# Patient Record
Sex: Male | Born: 1965 | Race: White | Hispanic: No | Marital: Married | Smoking: Former smoker
Health system: Southern US, Community
[De-identification: ages and names within clinical notes are randomized; demographics above are authoritative.]

## PROBLEM LIST (undated history)

## (undated) DIAGNOSIS — J449 Chronic obstructive pulmonary disease, unspecified: Secondary | ICD-10-CM

---

## 2012-08-30 ENCOUNTER — Emergency Department (INDEPENDENT_AMBULATORY_CARE_PROVIDER_SITE_OTHER)
Admission: EM | Admit: 2012-08-30 | Discharge: 2012-08-30 | Disposition: A | Discharge status: Home / Self Care | Payer: BC Managed Care – PPO | Source: Home / Self Care | Attending: Family Medicine | Admitting: Family Medicine

## 2012-08-30 DIAGNOSIS — J441 Chronic obstructive pulmonary disease with (acute) exacerbation: Secondary | ICD-10-CM

## 2012-08-30 DIAGNOSIS — J449 Chronic obstructive pulmonary disease, unspecified: Secondary | ICD-10-CM | POA: Insufficient documentation

## 2012-08-30 HISTORY — DX: Chronic obstructive pulmonary disease, unspecified: J44.9

## 2012-08-30 MED ORDER — ALBUTEROL SULFATE (2.5 MG/3ML) 0.083% IN NEBU: 2.5000 mg | INHALATION_SOLUTION | Freq: Four times a day (QID) | RESPIRATORY_TRACT | Status: DC | PRN

## 2012-08-30 MED ORDER — DOXYCYCLINE HYCLATE 100 MG PO CAPS: 100.0000 mg | ORAL_CAPSULE | Freq: Two times a day (BID) | ORAL | Status: DC

## 2012-08-30 MED ORDER — ALBUTEROL SULFATE HFA 108 (90 BASE) MCG/ACT IN AERS: 2.0000 | INHALATION_SPRAY | RESPIRATORY_TRACT | Status: DC | PRN

## 2012-08-30 MED ORDER — BUDESONIDE-FORMOTEROL FUMARATE 160-4.5 MCG/ACT IN AERO: 2.0000 | INHALATION_SPRAY | Freq: Two times a day (BID) | RESPIRATORY_TRACT | Status: DC

## 2012-08-30 MED ORDER — PREDNISONE 20 MG PO TABS: 20.0000 mg | ORAL_TABLET | Freq: Two times a day (BID) | ORAL | Status: DC

## 2012-08-30 NOTE — ED Notes (Signed)
Randy Jackson complains of shortness of breath and wheezing for 1 week. He has a history of COPD. Flare up started around Monday.

## 2012-08-30 NOTE — ED Provider Notes (Signed)
CSN: [id number]     Arrival date & time 08/30/12  1241 History   First MD Initiated Contact with Patient 08/30/12 1415     Chief Complaint  Patient presents with  . Shortness of Breath    x 1 week  . Wheezing    x 1 week      HPI Comments: Patient has a history of COPD, often exacerbated by exposure to allergens.  He has had increased sneezing, nasal congestion, shortness of breath with activity, wheezing for one week but minimal cough.  No sore throat or URI symptoms.  No pleuritic pain.  No fevers, chills, and sweats   He had pneumonia earlier this year.  The history is provided by the patient.    Past Medical History  Diagnosis Date  . COPD (chronic obstructive pulmonary disease)    History reviewed. No pertinent past surgical history. History reviewed. No pertinent family history. History  Substance Use Topics  . Smoking status: Current Every Day Smoker -- 1.00 packs/day for 30 years    Types: Cigarettes  . Smokeless tobacco: Never Used  . Alcohol Use: No    Review of Systems No sore throat + cough No pleuritic pain + wheezing + nasal congestion + post-nasal drainage + sneezing No sinus pain/pressure No itchy/red eyes No earache No hemoptysis + SOB with activity No fever/chills No nausea No vomiting No abdominal pain No diarrhea No urinary symptoms No skin rashes No fatigue No myalgias + headache Used OTC meds without relief  Allergies  Review of patient's allergies indicates no known allergies.  Home Medications   Current Outpatient Rx  Name  Route  Sig  Dispense  Refill  . albuterol (ACCUNEB) 1.25 MG/3ML nebulizer solution   Nebulization   Take 1 ampule by nebulization every 6 (six) hours as needed for wheezing.         Marland Kitchen albuterol (PROVENTIL HFA;VENTOLIN HFA) 108 (90 BASE) MCG/ACT inhaler   Inhalation   Inhale 2 puffs into the lungs every 6 (six) hours as needed for wheezing.         . budesonide-formoterol (SYMBICORT) 160-4.5 MCG/ACT  inhaler   Inhalation   Inhale 2 puffs into the lungs 2 (two) times daily.         Marland Kitchen dextromethorphan-guaiFENesin (MUCINEX DM) 30-600 MG per 12 hr tablet   Oral   Take 1 tablet by mouth every 12 (twelve) hours.         Marland Kitchen albuterol (PROVENTIL HFA;VENTOLIN HFA) 108 (90 BASE) MCG/ACT inhaler   Inhalation   Inhale 2 puffs into the lungs every 4 (four) hours as needed for wheezing.   2 Inhaler   2   . albuterol (PROVENTIL) (2.5 MG/3ML) 0.083% nebulizer solution   Nebulization   Take 3 mLs (2.5 mg total) by nebulization every 6 (six) hours as needed for wheezing.   75 mL   1   . budesonide-formoterol (SYMBICORT) 160-4.5 MCG/ACT inhaler   Inhalation   Inhale 2 puffs into the lungs 2 (two) times daily.   1 Inhaler   1   . doxycycline (VIBRAMYCIN) 100 MG capsule   Oral   Take 1 capsule (100 mg total) by mouth 2 (two) times daily.   20 capsule   0   . predniSONE (DELTASONE) 20 MG tablet   Oral   Take 1 tablet (20 mg total) by mouth 2 (two) times daily.   10 tablet   0    BP 132/82  Pulse 71  Temp(Src) 97.9  F (36.6 C) (Oral)  Ht 5\' 10"  (1.778 m)  Wt 185 lb (83.915 kg)  BMI 26.54 kg/m2  SpO2 95% Physical Exam Nursing notes and Vital Signs reviewed. Appearance:  Patient appears healthy, stated age, and in no acute distress Eyes:  Pupils are equal, round, and reactive to light and accomodation.  Extraocular movement is intact.  Conjunctivae are not inflamed  Ears:  Canals normal.  Tympanic membranes normal.  Nose:  Mildly congested turbinates.  No sinus tenderness.   Pharynx:  Normal Neck:  Supple.  Slightly tender shotty posterior nodes are palpated bilaterally  Lungs:   Tubular breath sounds, otherwise clear.  Breath sounds are equal.  Heart:  Regular rate and rhythm without murmurs, rubs, or gallops.  Abdomen:  Nontender without masses or hepatosplenomegaly.  Bowel sounds are present.  No CVA or flank tenderness.  Extremities:  No edema.  No calf tenderness Skin:   No rash present.   ED Course  Procedures  none        MDM   1. COPD exacerbation    Rx written for albuterol inhaler and albuterol for nebulizer.  Rx for Symbicort Begin prednisone burst and doxycycline for 10 days. If symptoms become significantly worse during the night or over the weekend, proceed to the local emergency room.  Followup with Family Doctor if not improved in one week.     Lattie Haw, MD 09/01/12 [phone]

## 2012-09-14 ENCOUNTER — Emergency Department (INDEPENDENT_AMBULATORY_CARE_PROVIDER_SITE_OTHER): Payer: BC Managed Care – PPO

## 2012-09-14 ENCOUNTER — Emergency Department (INDEPENDENT_AMBULATORY_CARE_PROVIDER_SITE_OTHER)
Admission: EM | Admit: 2012-09-14 | Discharge: 2012-09-14 | Disposition: A | Discharge status: Home / Self Care | Payer: BC Managed Care – PPO | Source: Home / Self Care

## 2012-09-14 DIAGNOSIS — R059 Cough, unspecified: Secondary | ICD-10-CM

## 2012-09-14 DIAGNOSIS — B370 Candidal stomatitis: Secondary | ICD-10-CM

## 2012-09-14 DIAGNOSIS — J441 Chronic obstructive pulmonary disease with (acute) exacerbation: Secondary | ICD-10-CM

## 2012-09-14 DIAGNOSIS — R05 Cough: Secondary | ICD-10-CM

## 2012-09-14 DIAGNOSIS — R062 Wheezing: Secondary | ICD-10-CM

## 2012-09-14 DIAGNOSIS — R918 Other nonspecific abnormal finding of lung field: Secondary | ICD-10-CM

## 2012-09-14 LAB — COMPLETE METABOLIC PANEL WITH GFR
BUN: 15 mg/dL (ref 6–23)
CO2: 24 mEq/L (ref 19–32)
Creat: 0.77 mg/dL (ref 0.50–1.35)
GFR, Est African American: >89 mL/min
GFR, Est Non African American: >89 mL/min
Glucose, Bld: 96 mg/dL (ref 70–99)
Sodium: 139 mEq/L (ref 135–145)
Total Bilirubin: 0.7 mg/dL (ref 0.3–1.2)
Total Protein: 6.7 g/dL (ref 6.0–8.3)

## 2012-09-14 LAB — POCT CBC W AUTO DIFF (K'VILLE URGENT CARE)

## 2012-09-14 MED ORDER — METHYLPREDNISOLONE ACETATE 80 MG/ML IJ SUSP
80.0000 mg | Freq: Once | INTRAMUSCULAR | Status: AC
  Administered 2012-09-14: 80 mg via INTRAMUSCULAR

## 2012-09-14 MED ORDER — CLOTRIMAZOLE 10 MG MT TROC: 10.0000 mg | Freq: Every day | OROMUCOSAL | Status: DC

## 2012-09-14 MED ORDER — LEVOFLOXACIN 500 MG PO TABS: 500.0000 mg | ORAL_TABLET | Freq: Every day | ORAL | Status: DC

## 2012-09-14 MED ORDER — IPRATROPIUM-ALBUTEROL 20-100 MCG/ACT IN AERS: 1.0000 | INHALATION_SPRAY | Freq: Four times a day (QID) | RESPIRATORY_TRACT | Status: AC | PRN

## 2012-09-14 MED ORDER — IPRATROPIUM-ALBUTEROL 0.5-2.5 (3) MG/3ML IN SOLN: 3.0000 mL | Freq: Four times a day (QID) | RESPIRATORY_TRACT | Status: AC | PRN

## 2012-09-14 NOTE — ED Notes (Signed)
Randy Jackson complains of shortness of breath, wheezing and cough for 3 weeks. He was seen 2 weeks ago and treated with prednisone. He states he is worse. Other symptoms: headache, runny nose, body aches and dizziness.

## 2012-09-14 NOTE — ED Provider Notes (Signed)
CSN: [id number]     Arrival date & time 09/14/12  1329 History   None    Chief Complaint  Patient presents with  . Wheezing    x 3 weeks  . Cough    x 3 weeks     HPI Comments: Chart reviewed. See previous visit note. Patient reports that he improved after treatment received two weeks ago.  Approximately 2 days later, about a week ago, he felt as if he was developing a new respiratory infection.  He developed recurrent sinus congestion, frontal headache, increased cough, wheezing, and shortness of breath.  He now has shortness of breath at night, and with activity.  He denies fever but has felt hot, with sweats at night.  He reports that albuterol by MDI or nebulizer is no longer effective.  He states that Symbicort is helpful, but he uses it more than twice daily.  He also complains of a developing sore throat. Patient continues to smoke. Family history includes an uncle with COPD.                                                                                             The history is provided by the patient.    Past Medical History  Diagnosis Date  . COPD (chronic obstructive pulmonary disease)    History reviewed. No pertinent past surgical history. History reviewed.  COPD in an uncle  History  Substance Use Topics  . Smoking status: Current Every Day Smoker -- 1.00 packs/day for 30 years    Types: Cigarettes  . Smokeless tobacco: Never Used  . Alcohol Use: No    Review of Systems + sore throat + cough No pleuritic pain + wheezing + nasal congestion + post-nasal drainage No sinus pain/pressure No itchy/red eyes No earache No hemoptysis + SOB No fever, + chills No nausea No vomiting No abdominal pain No diarrhea No urinary symptoms No skin rashes + fatigue No myalgias + headache Used OTC meds without relief  Allergies  Review of patient's allergies indicates no known allergies.  Home Medications   Current Outpatient Rx  Name  Route  Sig  Dispense   Refill  . albuterol (PROVENTIL HFA;VENTOLIN HFA) 108 (90 BASE) MCG/ACT inhaler   Inhalation   Inhale 2 puffs into the lungs every 4 (four) hours as needed for wheezing.   2 Inhaler   2   . albuterol (PROVENTIL) (2.5 MG/3ML) 0.083% nebulizer solution   Nebulization   Take 3 mLs (2.5 mg total) by nebulization every 6 (six) hours as needed for wheezing.   75 mL   1   . budesonide-formoterol (SYMBICORT) 160-4.5 MCG/ACT inhaler   Inhalation   Inhale 2 puffs into the lungs 2 (two) times daily.         Marland Kitchen dextromethorphan-guaiFENesin (MUCINEX DM) 30-600 MG per 12 hr tablet   Oral   Take 1 tablet by mouth every 12 (twelve) hours.         Marland Kitchen albuterol (ACCUNEB) 1.25 MG/3ML nebulizer solution   Nebulization   Take 1 ampule by nebulization every 6 (six) hours as needed for wheezing.         Marland Kitchen  albuterol (PROVENTIL HFA;VENTOLIN HFA) 108 (90 BASE) MCG/ACT inhaler   Inhalation   Inhale 2 puffs into the lungs every 6 (six) hours as needed for wheezing.         . budesonide-formoterol (SYMBICORT) 160-4.5 MCG/ACT inhaler   Inhalation   Inhale 2 puffs into the lungs 2 (two) times daily.   1 Inhaler   1   . clotrimazole (MYCELEX) 10 MG troche   Oral   Take 1 tablet (10 mg total) by mouth 5 (five) times daily.   70 tablet   0   . doxycycline (VIBRAMYCIN) 100 MG capsule   Oral   Take 1 capsule (100 mg total) by mouth 2 (two) times daily.   20 capsule   0   . Ipratropium-Albuterol (COMBIVENT) 20-100 MCG/ACT AERS respimat   Inhalation   Inhale 1 puff into the lungs every 6 (six) hours as needed.   1 Inhaler   1   . ipratropium-albuterol (DUONEB) 0.5-2.5 (3) MG/3ML SOLN   Nebulization   Take 3 mLs by nebulization every 6 (six) hours as needed.   360 mL   0   . levofloxacin (LEVAQUIN) 500 MG tablet   Oral   Take 1 tablet (500 mg total) by mouth daily.   7 tablet   0   . predniSONE (DELTASONE) 20 MG tablet   Oral   Take 1 tablet (20 mg total) by mouth 2 (two) times  daily.   10 tablet   0    BP 127/79  Pulse 93  Temp(Src) 97.9 F (36.6 C) (Oral)  Ht 5\' 10"  (1.778 m)  Wt 183 lb (83.008 kg)  BMI 26.26 kg/m2  SpO2 92% Physical Exam Nursing notes and Vital Signs reviewed. Appearance:  Patient appears stated age, and in no acute distress Eyes:  Pupils are equal, round, and reactive to light and accomodation.  Extraocular movement is intact.  Conjunctivae are not inflamed  Ears:  Canals normal.  Tympanic membranes normal.  Nose:  Mildly congested turbinates.  No sinus tenderness.    Pharynx:  Erythematous soft palate and pharynx with multiple small white plaques Neck:  Supple.  Slightly tender shotty posterior nodes are palpated bilaterally  Lungs:   Diffuse rhonchi and musical wheezes throughout, heard best posteriorly.    Heart:  Regular rate and rhythm without murmurs, rubs, or gallops.  Abdomen:  Nontender without masses or hepatosplenomegaly.  Bowel sounds are present.  No CVA or flank tenderness.  Extremities:  No edema.  No calf tenderness Skin:  No rash present.   ED Course  Procedures  DuoNeb nebulizer treatment.  Patient reports decreased shortness of breath and wheezing afterwards.  Lungs reveal significant decrease in rhonchi and wheezes                                                          Labs Reviewed     ALPHA-1-ANTITRYPSIN pending  COMPLETE METABOLIC PANEL WITH GFR pending  POCT CBC W AUTO DIFF ([facility]) WBC 11.2; LY 21.2; MO 7.2; GR 71.6; Hgb 16.0; Platelets 186    Imaging Review Dg Chest 2    09/14/2012   CLINICAL DATA:  Cough and wheezing for 3 weeks  EXAM: CHEST  2 VIEW  COMPARISON:  None.  FINDINGS: Cardiac size is unremarkable. No acute infiltrate  or pulmonary edema. Central mild bronchitic changes. There is subtle mild right upper paratracheal soft tissue prominence. Although this may be vascular in nature adenopathy cannot be excluded. Further correlation with CT scan with IV contrast is recommended. Bony  thorax is unremarkable.  IMPRESSION: No acute infiltrate or pulmonary edema. Central mild bronchitic changes. There is subtle mild right upper paratracheal soft tissue prominence. Although this may be vascular in nature adenopathy cannot be excluded. Further correlation with CT scan with IV contrast is recommended. Bony thorax is unremarkable.   Electronically Signed   By: Natasha Mead   On: 09/14/2012 14:45    MDM   1. Wheezing   2. Obstructive chronic bronchitis with exacerbation.  Note mild leukocytosis.  Note paratracheal soft tissue prominence on chest X-ray.   3. Oral candidiasis    DepoMedrol 80mg  IM.  Begin Levaquin. Discontinue albuterol and begin ipratropium/albuterol either by MDI or nebulizer. Continue Symbicort.  Advised to use Symbicort ONLY twice daily.  Advised to rinse mouth after each use of Symbicort inhaler. Begin Mycelex troches for two weeks. Take plain Mucinex (guaifenesin) twice daily for cough and congestion.  Increase fluid intake, rest. Screen for alpha1-antitrypsin deficiency.  Check hepatic function as well with CMP Refer to pulmonologist approximately 10 days for follow-up CT scan chest with IV contrast, long-term management, and address smoking cessation. Patient also advised to establish relationship with a PCP. If symptoms become significantly worse during the night or over the weekend, proceed to the local emergency room.        Lattie Haw, MD 09/14/12 [phone]

## 2012-09-15 ENCOUNTER — Telehealth: Payer: Self-pay | Admitting: *Deleted

## 2012-09-17 ENCOUNTER — Telehealth: Payer: Self-pay | Admitting: *Deleted

## 2012-11-25 ENCOUNTER — Emergency Department (INDEPENDENT_AMBULATORY_CARE_PROVIDER_SITE_OTHER): Payer: BC Managed Care – PPO

## 2012-11-25 ENCOUNTER — Telehealth: Payer: Self-pay | Admitting: *Deleted

## 2012-11-25 ENCOUNTER — Emergency Department (INDEPENDENT_AMBULATORY_CARE_PROVIDER_SITE_OTHER)
Admission: EM | Admit: 2012-11-25 | Discharge: 2012-11-25 | Disposition: A | Discharge status: Home / Self Care | Payer: BC Managed Care – PPO | Source: Home / Self Care | Attending: Emergency Medicine | Admitting: Emergency Medicine

## 2012-11-25 ENCOUNTER — Encounter: Payer: Self-pay | Admitting: Emergency Medicine

## 2012-11-25 DIAGNOSIS — R0602 Shortness of breath: Secondary | ICD-10-CM

## 2012-11-25 DIAGNOSIS — J45901 Unspecified asthma with (acute) exacerbation: Secondary | ICD-10-CM

## 2012-11-25 DIAGNOSIS — J209 Acute bronchitis, unspecified: Secondary | ICD-10-CM

## 2012-11-25 DIAGNOSIS — J441 Chronic obstructive pulmonary disease with (acute) exacerbation: Secondary | ICD-10-CM

## 2012-11-25 DIAGNOSIS — J438 Other emphysema: Secondary | ICD-10-CM

## 2012-11-25 MED ORDER — CLARITHROMYCIN 500 MG PO TABS: ORAL_TABLET | ORAL | Status: DC

## 2012-11-25 MED ORDER — CEFTRIAXONE SODIUM 1 G IJ SOLR
1.0000 g | INTRAMUSCULAR | Status: AC
  Administered 2012-11-25: 1 g via INTRAMUSCULAR

## 2012-11-25 MED ORDER — PREDNISONE 10 MG PO TABS: ORAL_TABLET | ORAL | Status: DC

## 2012-11-25 MED ORDER — IPRATROPIUM-ALBUTEROL 0.5-2.5 (3) MG/3ML IN SOLN
3.0000 mL | RESPIRATORY_TRACT | Status: AC
  Administered 2012-11-25: 3 mL via RESPIRATORY_TRACT

## 2012-11-25 MED ORDER — METHYLPREDNISOLONE ACETATE 80 MG/ML IJ SUSP
80.0000 mg | Freq: Once | INTRAMUSCULAR | Status: AC
  Administered 2012-11-25: 80 mg via INTRAMUSCULAR

## 2012-11-25 NOTE — ED Provider Notes (Signed)
CSN: [id number]     Arrival date & time 11/25/12  1218 History   First MD Initiated Contact with Patient 11/25/12 1234     Chief Complaint  Patient presents with  . URI  . Shortness of Breath    HPI Trip began having respiratory symptoms, cough, wheezing 3 months ago. He has been seen twice and treated with combivent, duo nebs and Levaquin, and 6 day prednisone Dosepak.   Complains of worsening dyspnea with exertion and @ rest, with cough productive of yellow sputum, x4 days.  +Smoker.--He states he's cut back to smoking a half pack a day. He states he's smoked about 2 packs a day for 30 years, equals 60 pack years smoking history.  Was last seen here in urgent care 09/14/2012 for wheezing and obstructive chronic bronchitis with exacerbation. At that time, treated with a 6 day prednisone Dosepak, and he feels that definitely helped some, but that he needed more days of that dose pack to get the full benefit. Chest x-ray then showed paratracheal soft tissue prominence on chest x-ray. Our office had originally made a referral appointment for patient to see a pulmonologist on 10/01/2012, but for some reason, he did not go to that appointment.--He states that it might be that his cell phone was not working at that time.  He presents today with 4 days of worsening cough productive of yellow sputum with worsening wheezing and dyspnea on exertion. Associated symptoms: No hemoptysis. No syncope. Denies chest pain at rest or with exertion.  Used Combivent past few weeks, and that helped some. Used duo neb at home and that helped somewhat about 7 hours ago.  After further questioning regarding his past medical history, he states that he was diagnosed with asthma as a child, but that he "outgrew it"  Other labs ordered by Dr Isac Sarna from 09/14/2012: CMP was normal including normal glucose. Hemoglobin 16.0, WBC 11.2 with 71% granulocytes. Alpha-1 anti-trypsin level was normal at 109 (normal range  90-200)  Past Medical History  Diagnosis Date  . COPD (chronic obstructive pulmonary disease)    History reviewed. No pertinent past surgical history. History reviewed. No pertinent family history. History  Substance Use Topics  . Smoking status: Current Every Day Smoker -- 1.00 packs/day for 30 years    Types: Cigarettes  . Smokeless tobacco: Never Used  . Alcohol Use: No    Review of Systems  Constitutional: Positive for fatigue.  HENT: Negative for ear pain, nosebleeds and rhinorrhea.   Eyes:       Occasional blurred vision  Respiratory: Positive for shortness of breath and wheezing.   Cardiovascular: Negative for chest pain, palpitations and leg swelling.  Gastrointestinal: Negative.  Negative for nausea, vomiting, diarrhea and blood in stool.  Genitourinary: Negative.   Musculoskeletal: Negative.  Negative for joint swelling.  Skin: Negative.  Negative for rash.  Allergic/Immunologic: Negative for immunocompromised state.  Neurological: Negative.   Hematological: Negative.   Psychiatric/Behavioral: Negative.     Allergies  Review of patient's allergies indicates no known allergies.  Home Medications   Current Outpatient Rx  Name  Route  Sig  Dispense  Refill  . budesonide-formoterol (SYMBICORT) 160-4.5 MCG/ACT inhaler   Inhalation   Inhale 2 puffs into the lungs 2 (two) times daily.         . budesonide-formoterol (SYMBICORT) 160-4.5 MCG/ACT inhaler   Inhalation   Inhale 2 puffs into the lungs 2 (two) times daily.   1 Inhaler   1   .  clarithromycin (BIAXIN) 500 MG tablet      Take 1 twice a day for 10 days.   20 tablet   0   . Ipratropium-Albuterol (COMBIVENT) 20-100 MCG/ACT AERS respimat   Inhalation   Inhale 1 puff into the lungs every 6 (six) hours as needed.   1 Inhaler   1   . ipratropium-albuterol (DUONEB) 0.5-2.5 (3) MG/3ML SOLN   Nebulization   Take 3 mLs by nebulization every 6 (six) hours as needed.   360 mL   0   . predniSONE  (DELTASONE) 10 MG tablet      Days 1 & 2, take 6 daily. Days 3 & 4, take 5 daily. Days 5 & 6: 4 daily. Days 7 &8: 3 daily. Days 9 &10: 2 daily. Days 11 &12: 1 daily.   42 tablet   0    BP 154/85  Pulse 102  Temp(Src) 97.7 F (36.5 C) (Oral)  Resp 16  Wt 186 lb (84.369 kg)  SpO2 92% Physical Exam  Nursing note and vitals reviewed. Constitutional: He is oriented to person, place, and time. He appears well-developed and well-nourished. He is active and cooperative.  Non-toxic appearance. He appears ill. He appears distressed (Mildly dyspneic at rest ).  HENT:  Head: Normocephalic and atraumatic.  Right Ear: Tympanic membrane normal.  Left Ear: Tympanic membrane normal.  Nose: Nose normal.  Mouth/Throat: Oropharynx is clear and moist. No oropharyngeal exudate.  Eyes: Conjunctivae are normal. Pupils are equal, round, and reactive to light. Right eye exhibits no discharge. Left eye exhibits no discharge. No scleral icterus.  Neck: Neck supple. No JVD present. No tracheal deviation present. No thyromegaly present.  Cardiovascular: Normal rate, regular rhythm and normal heart sounds.  Exam reveals no gallop.   No murmur heard. Pulmonary/Chest: No respiratory distress. He has wheezes (Diffuse, inspiratory and especially late expiratory wheezes throughout ). He has rhonchi. He has no rales.  Abdominal: Soft. He exhibits no distension.  Musculoskeletal: Normal range of motion. He exhibits no edema and no tenderness.  Mild clubbing of fingernails  Lymphadenopathy:    He has no cervical adenopathy.  Neurological: He is alert and oriented to person, place, and time.  Skin: Skin is warm and dry. No rash noted.  Psychiatric: He has a normal mood and affect.    ED Course  Procedures (including critical care time) Labs Review Labs Reviewed - No data to display Imaging Review Dg Chest 2 View  11/25/2012   CLINICAL DATA:  Shortness of breath.  COPD.  EXAM: CHEST  2 VIEW  COMPARISON:  PA and  lateral chest 09/14/2012.  FINDINGS: The chest is hyperexpanded with attenuation of the pulmonary vasculature. No consolidative process, pneumothorax or effusion. Heart size is normal.  IMPRESSION: Emphysema without acute disease.   Electronically Signed   By: Drusilla Kanner M.D.   On: 11/25/2012 13:32    EKG Interpretation    Date/Time:    Ventricular Rate:    PR Interval:    QRS Duration:   QT Interval:    QTC Calculation:   R Axis:     Text Interpretation:             DuoNeb nebulizer treatment given , then patient reassessed. He felt that improved his symptoms. Lungs rechecked, still has mild late expiratory wheezes throughout and mild diffuse rhonchi. Aeration improved.--Pulse ox 95% on room air. Pulse 96. Chest x-ray ordered, which showed hyperexpansion with attenuation of the pulmonary vasculature. Heart size normal. No  infiltrate or consolidation or pneumothorax or effusion.  MDM   1. Asthma, chronic obstructive, with acute exacerbation   2. COPD exacerbation   3. Acute bronchitis    Reviewed all the above. We discussed treatment options at length. Risks, benefits, alternatives discussed. Rocephin 1 g IM stat Depo-Medrol 80 mg IM stat Prednisone 10 mg-12 day Dosepak Biaxin 500 mg twice a day x10 days Continue home DuoNeb nebulizer treatment every 6 hours when necessary. While patient was still here in urgent care, we called and arranged referral to Dr. Eulah Pont at [facility] in [place], for pulmonary consultation and management.--Appointment made for 12/01/2012.--While patient was still here in urgent care, we had him speak directly with the appointment scheduler at [facility], and he was given specific information/details regarding this appointment, and written instructions given, and he voiced understanding. Precautions discussed. Red flags discussed.--Go to ER stat if any severe, new, or worsening symptoms. Questions invited and answered. Patient voiced  understanding and agreement.    Lajean Manes, MD 11/25/12 [phone]

## 2012-11-25 NOTE — ED Notes (Signed)
Randy Jackson began having a uri 3 months ago. He has been seen twice and treated with combivent, duo nebs and Levaquin. He is not improving. SOB with exertion and @ rest. Smoker.

## 2013-05-08 ENCOUNTER — Encounter: Payer: Self-pay | Admitting: Emergency Medicine

## 2013-05-08 ENCOUNTER — Emergency Department
Admission: EM | Admit: 2013-05-08 | Discharge: 2013-05-08 | Disposition: A | Discharge status: Home / Self Care | Payer: Self-pay | Source: Home / Self Care | Attending: Emergency Medicine | Admitting: Emergency Medicine

## 2013-05-08 ENCOUNTER — Emergency Department (INDEPENDENT_AMBULATORY_CARE_PROVIDER_SITE_OTHER): Payer: Self-pay

## 2013-05-08 DIAGNOSIS — M542 Cervicalgia: Secondary | ICD-10-CM

## 2013-05-08 DIAGNOSIS — S139XXA Sprain of joints and ligaments of unspecified parts of neck, initial encounter: Secondary | ICD-10-CM

## 2013-05-08 DIAGNOSIS — M545 Low back pain, unspecified: Secondary | ICD-10-CM

## 2013-05-08 DIAGNOSIS — S39012A Strain of muscle, fascia and tendon of lower back, initial encounter: Secondary | ICD-10-CM

## 2013-05-08 DIAGNOSIS — S161XXA Strain of muscle, fascia and tendon at neck level, initial encounter: Secondary | ICD-10-CM

## 2013-05-08 DIAGNOSIS — S335XXA Sprain of ligaments of lumbar spine, initial encounter: Secondary | ICD-10-CM

## 2013-05-08 MED ORDER — MELOXICAM 15 MG PO TABS: ORAL_TABLET | ORAL | Status: AC

## 2013-05-08 MED ORDER — CYCLOBENZAPRINE HCL 5 MG PO TABS: ORAL_TABLET | ORAL | Status: AC
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
## 2014-08-04 IMAGING — CR DG CHEST 2V
2 series · 2 of 2 positions shown · non-contrast
Comparison: None.

CLINICAL DATA: Cough and wheezing for 3 weeks

EXAM:
CHEST  2 VIEW

[view not recorded (1 of 2)]
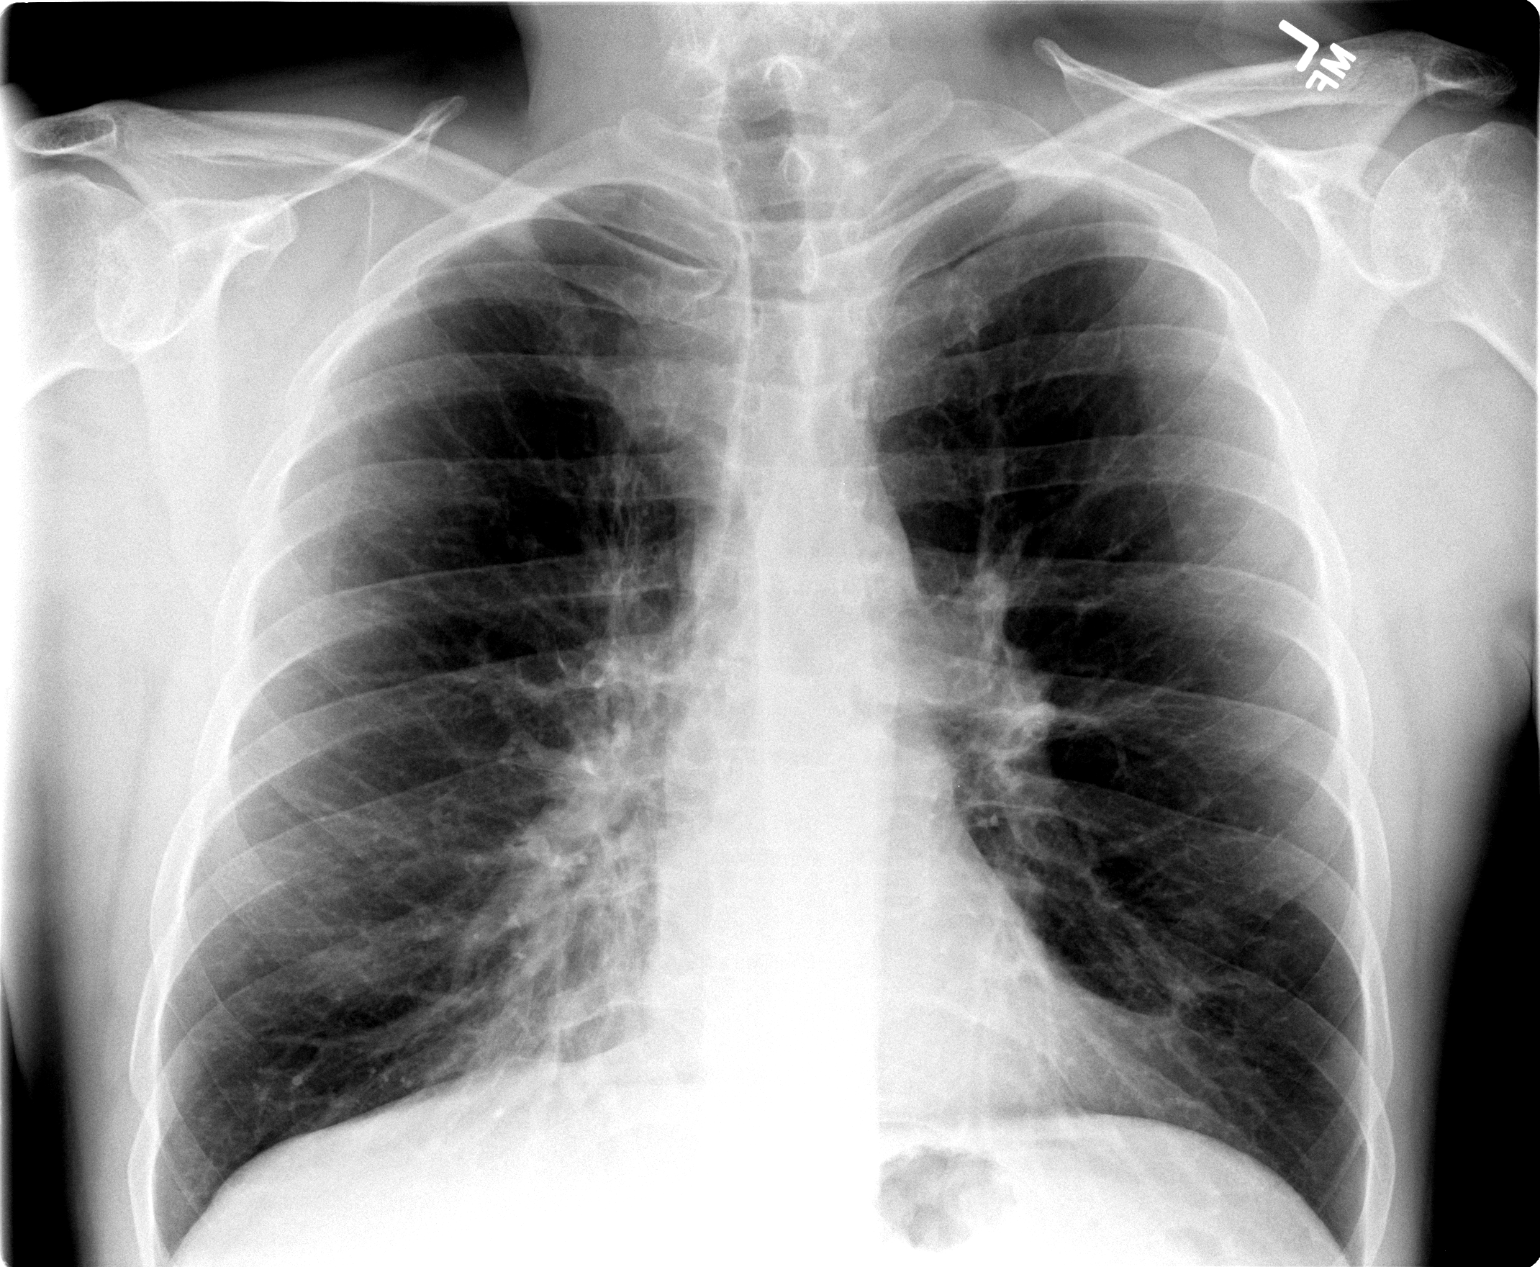

[view not recorded (2 of 2)]
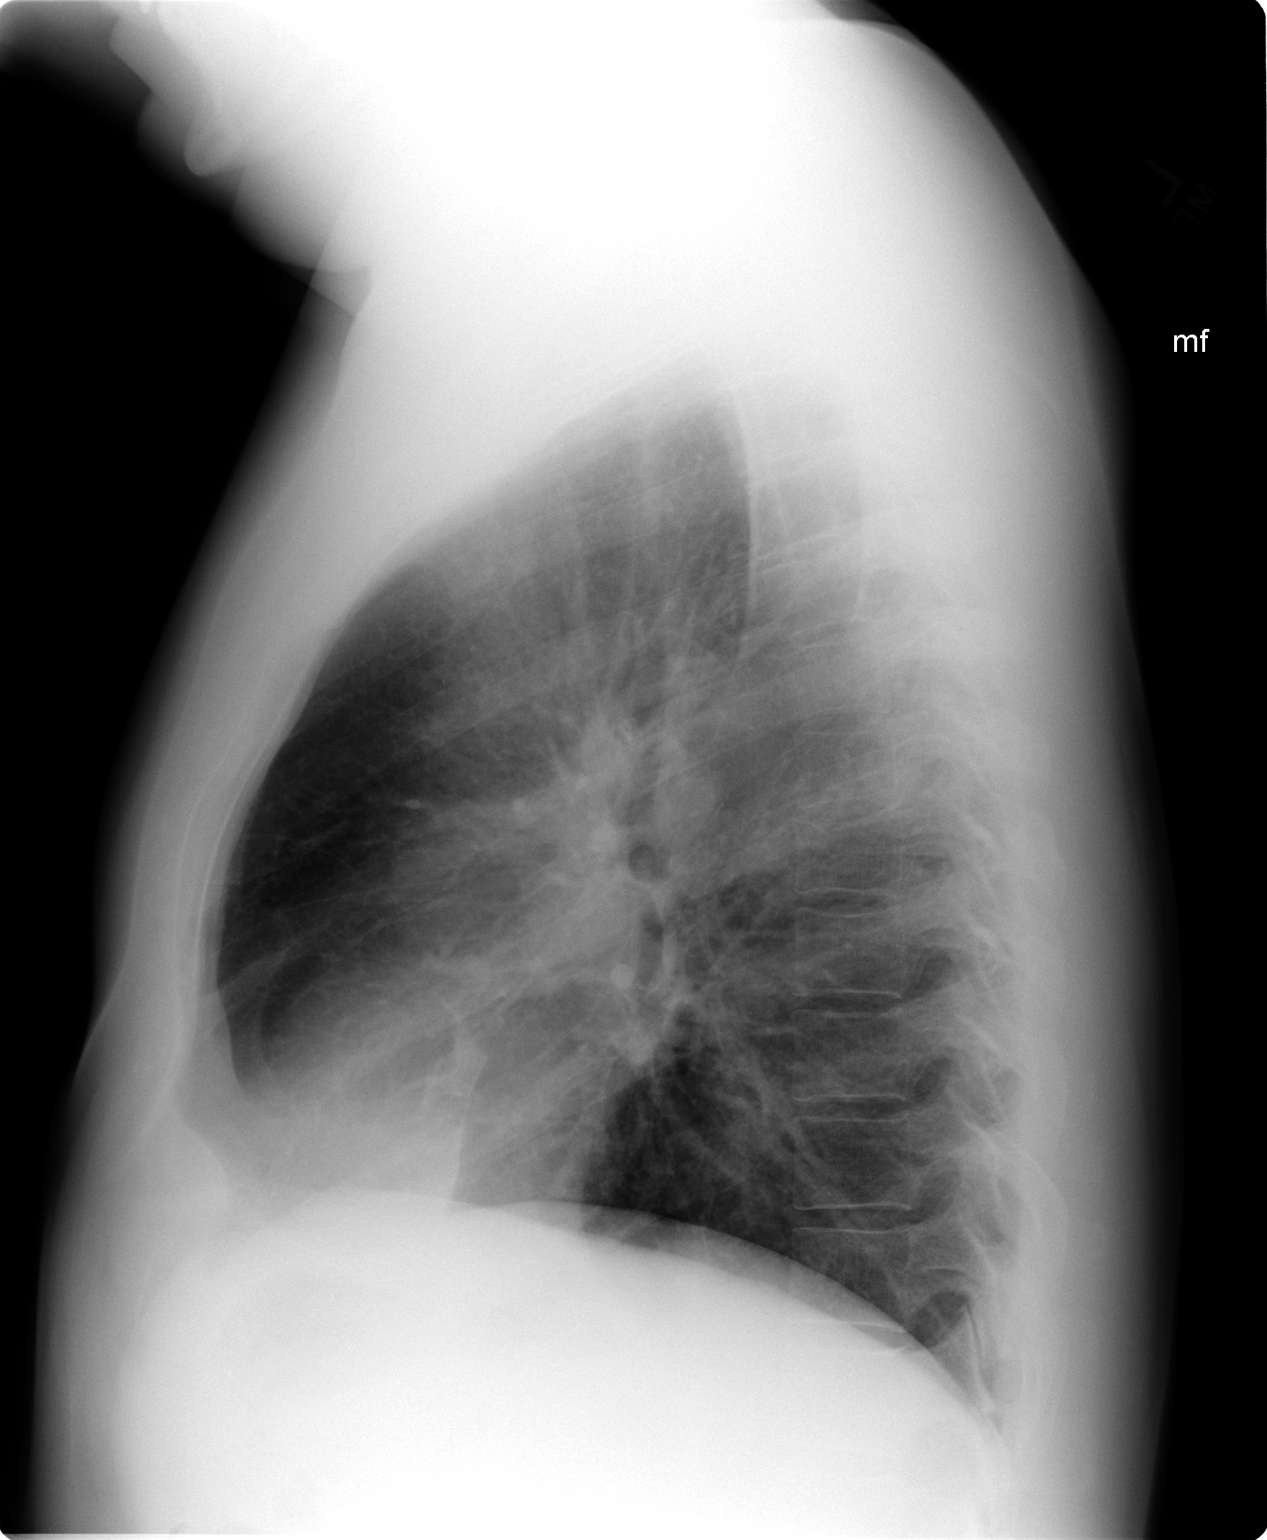

[2 of 2 positions shown; findings below may reference images not displayed]

FINDINGS: Cardiac size is unremarkable. No acute infiltrate or pulmonary
edema. Central mild bronchitic changes. There is subtle mild right
upper paratracheal soft tissue prominence. Although this may be
vascular in nature adenopathy cannot be excluded. Further
correlation with CT scan with IV contrast is recommended. Bony
thorax is unremarkable.
IMPRESSION: No acute infiltrate or pulmonary edema. Central mild bronchitic
changes. There is subtle mild right upper paratracheal soft tissue
prominence. Although this may be vascular in nature adenopathy
cannot be excluded. Further correlation with CT scan with IV
contrast is recommended. Bony thorax is unremarkable.

## 2014-10-15 IMAGING — CR DG CHEST 2V
2 series · 2 of 2 positions shown · non-contrast
Comparison: PA and lateral chest 09/14/2012.

CLINICAL DATA: Shortness of breath.  COPD.

EXAM:
CHEST  2 VIEW

[view not recorded (1 of 2)]
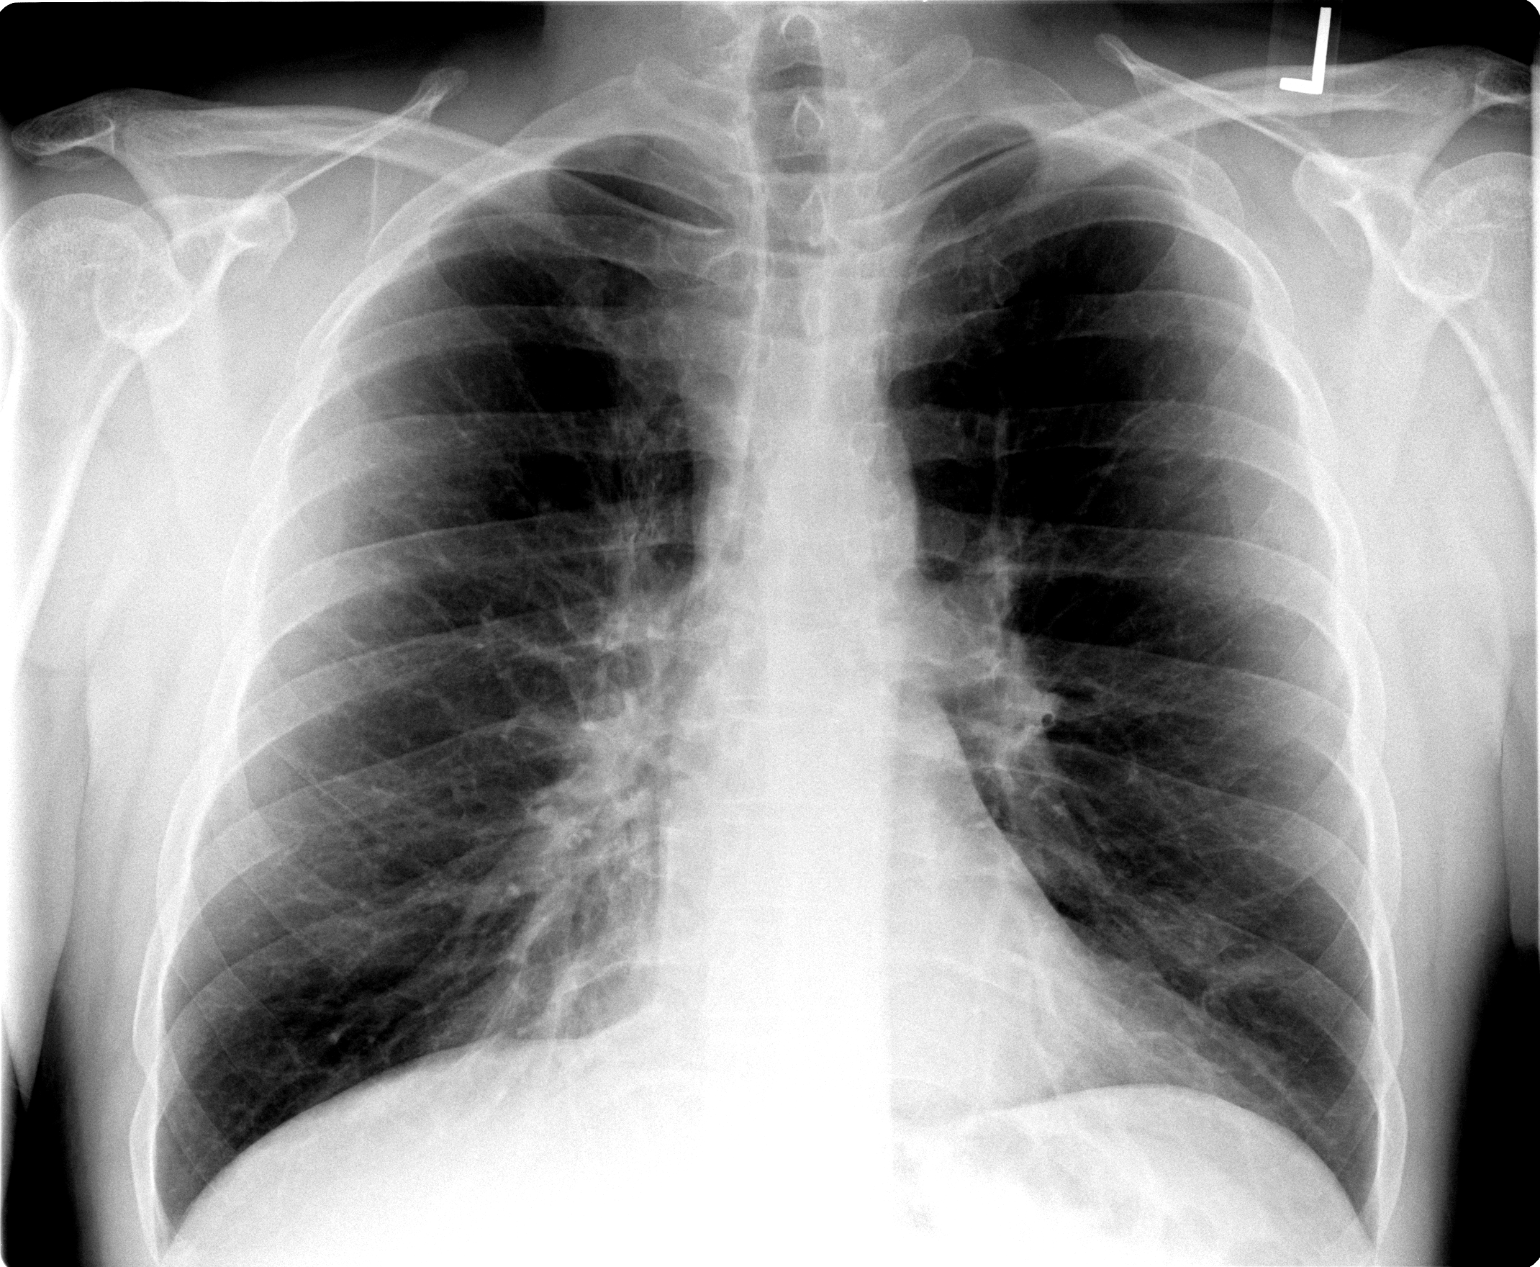

[view not recorded (2 of 2)]
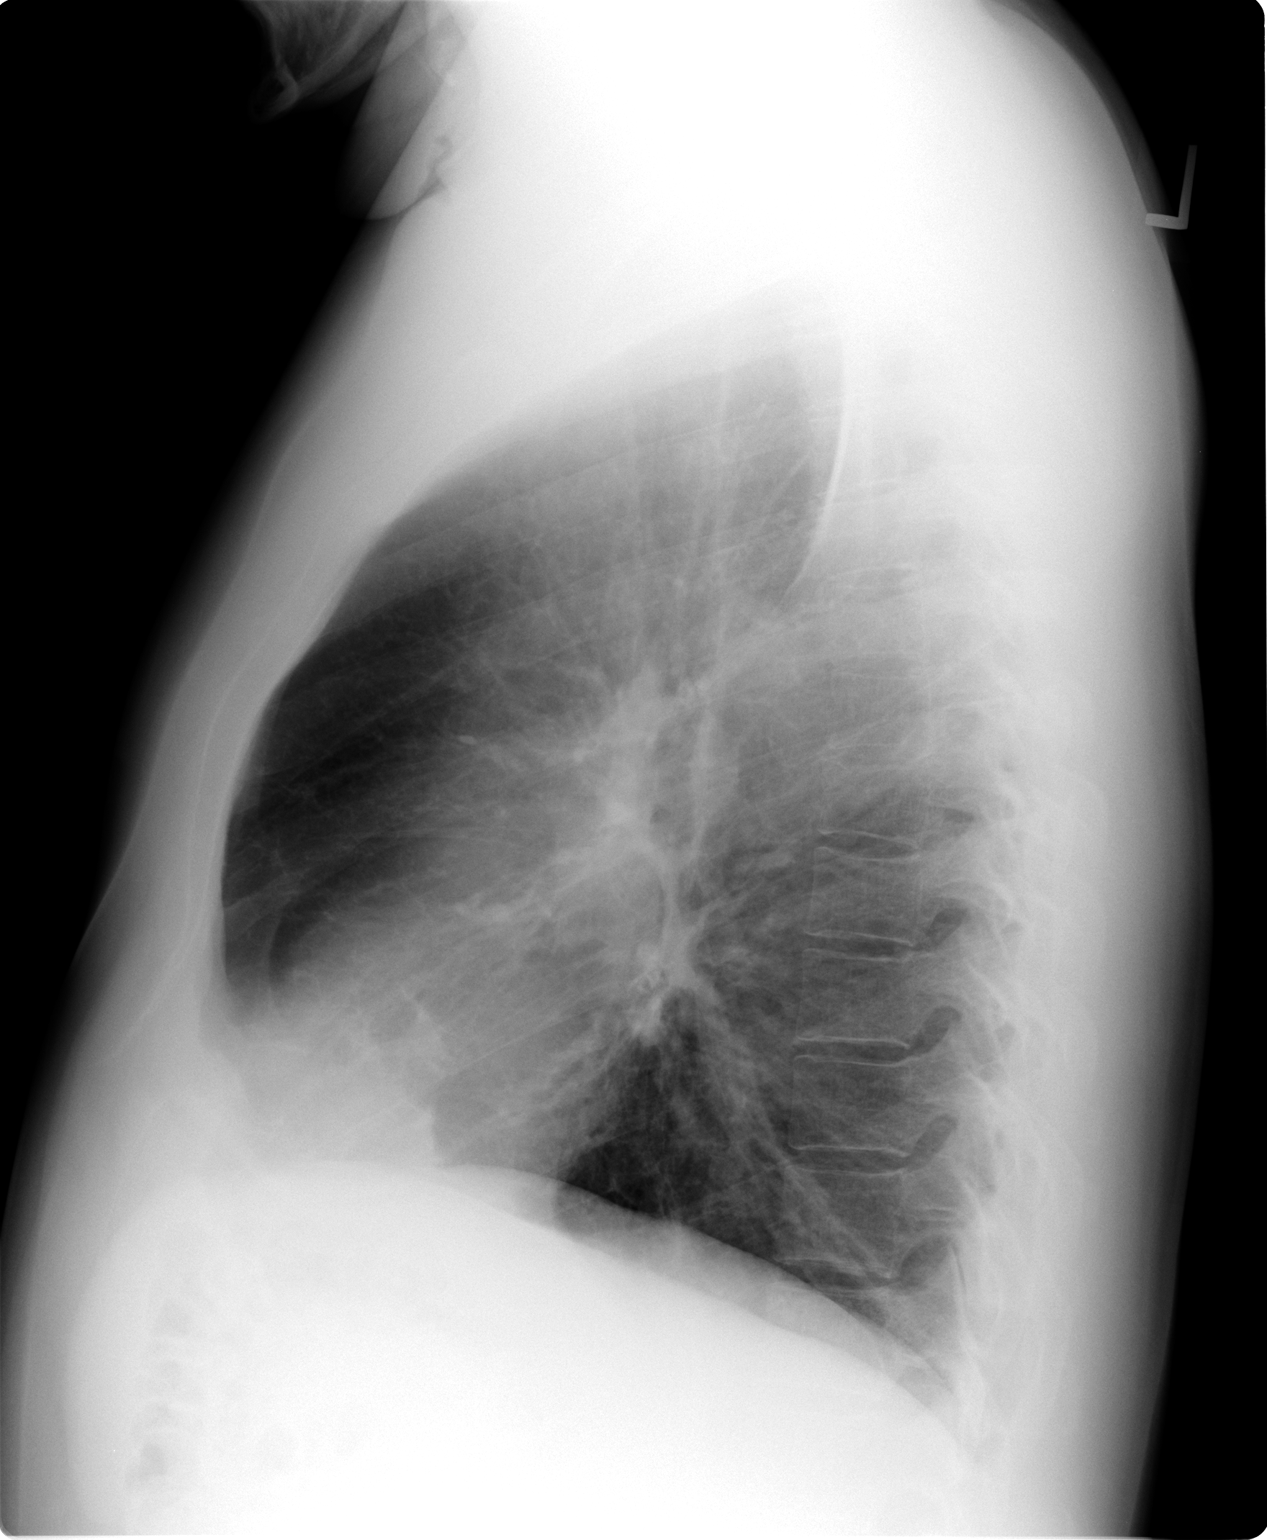

[2 of 2 positions shown; findings below may reference images not displayed]

FINDINGS: The chest is hyperexpanded with attenuation of the pulmonary
vasculature. No consolidative process, pneumothorax or effusion.
Heart size is normal.
IMPRESSION: Emphysema without acute disease.

## 2017-02-12 ENCOUNTER — Emergency Department (INDEPENDENT_AMBULATORY_CARE_PROVIDER_SITE_OTHER)
Admission: EM | Admit: 2017-02-12 | Discharge: 2017-02-12 | Disposition: A | Discharge status: Home / Self Care | Payer: BLUE CROSS/BLUE SHIELD | Source: Home / Self Care | Attending: Family Medicine | Admitting: Family Medicine

## 2017-02-12 ENCOUNTER — Other Ambulatory Visit: Payer: Self-pay

## 2017-02-12 DIAGNOSIS — J441 Chronic obstructive pulmonary disease with (acute) exacerbation: Secondary | ICD-10-CM

## 2017-02-12 MED ORDER — PREDNISONE 20 MG PO TABS: ORAL_TABLET | ORAL | 0 refills | Status: AC

## 2017-02-12 MED ORDER — METHYLPREDNISOLONE SODIUM SUCC 125 MG IJ SOLR
80.0000 mg | Freq: Once | INTRAMUSCULAR | Status: AC
  Administered 2017-02-12: 80 mg via INTRAMUSCULAR

## 2017-02-12 MED ORDER — DOXYCYCLINE HYCLATE 100 MG PO CAPS: 100.0000 mg | ORAL_CAPSULE | Freq: Two times a day (BID) | ORAL | 0 refills | Status: AC

## 2017-02-12 NOTE — ED Triage Notes (Signed)
Started last Monday with headache, fever, and aching joints.  The last few days, the headache has continued, and headache has not gone away. Last medication was 2 aleve at 9 am.

## 2017-02-12 NOTE — Discharge Instructions (Signed)
Begin prednisone Wednesday 02/13/17. Continue plain guaifenesin daily, with plenty of water, for cough and congestion.   Get adequate rest.   May use Afrin nasal spray (or generic oxymetazoline) each morning for about 5 days and then discontinue.  Also recommend using saline nasal spray several times daily and saline nasal irrigation (AYR is a common brand).  Use Flonase nasal spray each morning after using Afrin nasal spray and saline nasal irrigation. Try warm salt water gargles for sore throat.  Stop all antihistamines for now, and other non-prescription cough/cold preparations.  If symptoms become significantly worse during the night or over the weekend, proceed to the local emergency room.  Continue albuterol by nebulizer as needed.  Continue inhalers as prescribed.

## 2017-02-12 NOTE — ED Provider Notes (Signed)
[facility]    CSN: [id number] Arrival date & time: 02/12/17  1119     History   Chief Complaint Chief Complaint  Patient presents with  . Headache  . Cough    HPI Randy Buffric D. Janee Mornhompson is a 52 y.o. male.   Patient reports that he developed flu symptoms 8 days ago.  He complains of persistent headache and cough, with occasional wheezing and shortness of breath.  He denies fevers, chills, and sweats.  No pleuritic pain.  He has COPD and past history of pneumonia, followed by [facility].   The history is provided by the patient.    Past Medical History:  Diagnosis Date  . COPD (chronic obstructive pulmonary disease) [facility](HCC)     Patient Active Problem List   Diagnosis Date Noted  . COPD (chronic obstructive pulmonary disease) (HCC)     History reviewed. No pertinent surgical history.     Home Medications    Prior to Admission medications   Medication Sig Start Date End Date Taking? Authorizing Provider  budesonide-formoterol (SYMBICORT) 160-4.5 MCG/ACT inhaler Inhale 2 puffs into the lungs 2 (two) times daily.    [provider]  cyclobenzaprine (FLEXERIL) 5 MG tablet Take 1 every 8 hours as needed for muscle relaxant. Caution: May cause drowsiness. 05/08/13   Lajean ManesMassey, David, MD  doxycycline (VIBRAMYCIN) 100 MG capsule Take 1 capsule (100 mg total) by mouth 2 (two) times daily. Take with food. 02/12/17   Lattie HawBeese, Stephen A, MD  Ipratropium-Albuterol (COMBIVENT) 20-100 MCG/ACT AERS respimat Inhale 1 puff into the lungs every 6 (six) hours as needed. 09/14/12   Lattie HawBeese, Stephen A, MD  ipratropium-albuterol (DUONEB) 0.5-2.5 (3) MG/3ML SOLN Take 3 mLs by nebulization every 6 (six) hours as needed. 09/14/12   Lattie HawBeese, Stephen A, MD  meloxicam (MOBIC) 15 MG tablet Take 1 tablet by mouth daily for pain/inflammation. Take with food. Do not take other NSAIDs with this 05/08/13   Lajean ManesMassey, David, MD  predniSONE (DELTASONE) 20 MG tablet Take one tab by mouth twice  daily for 5 days, then one daily for 3 days. Take with food. 02/12/17   Lattie HawBeese, Stephen A, MD    Family History History reviewed. No pertinent family history.  Social History Social History   Tobacco Use  . Smoking status: Former Smoker    Packs/day: 1.00    Years: 30.00    Pack years: 30.00    Types: Cigarettes  . Smokeless tobacco: Never Used  Substance Use Topics  . Alcohol use: Yes  . Drug use: No     Allergies   Patient has no known allergies.   Review of Systems Review of Systems No sore throat + cough No pleuritic pain + wheezing + nasal congestion ? post-nasal drainage No sinus pain/pressure No itchy/red eyes No earache No hemoptysis + SOB No fever/chills No nausea No vomiting No abdominal pain No diarrhea No urinary symptoms No skin rash + fatigue No myalgias + arthralgias + headache Used OTC meds without relief   Physical Exam Triage Vital Signs ED Triage Vitals [02/12/17 1334]  Enc Vitals Group     BP 124/80     Pulse Rate 82     Resp      Temp 97.8 F (36.6 C)     Temp Source Oral     SpO2 93 %     Weight 198 lb (89.8 kg)     Height 5\' 10"  (1.778 m)     Head Circumference  Peak Flow      Pain Score 3     Pain Loc      Pain Edu?      Excl. in GC?    No data found.  Updated Vital Signs BP 124/80 (BP Location: Right Arm)   Pulse 82   Temp 97.8 F (36.6 C) (Oral)   Ht 5\' 10"  (1.778 m)   Wt 198 lb (89.8 kg)   SpO2 93%   BMI 28.41 kg/m   Visual Acuity Right Eye Distance:   Left Eye Distance:   Bilateral Distance:    Right Eye Near:   Left Eye Near:    Bilateral Near:     Physical Exam Nursing notes and Vital Signs reviewed. Appearance:  Patient appears stated age, and in no acute distress Eyes:  Pupils are equal, round, and reactive to light and accomodation.  Extraocular movement is intact.  Conjunctivae are not inflamed  Ears:  Canals normal.  Tympanic membranes normal.  Nose:  Mildly congested turbinates.   No sinus tenderness.  Pharynx:  Normal Neck:  Supple.  Enlarged posterior/lateral nodes are palpated bilaterally, tender to palpation on the left.   Lungs:  Course breath sounds.  Breath sounds are equal.  Moving air well. Heart:  Regular rate and rhythm without murmurs, rubs, or gallops.  Abdomen:  Nontender without masses or hepatosplenomegaly.  Bowel sounds are present.  No CVA or flank tenderness.  Extremities:  No edema.  Skin:  No rash present.    UC Treatments / Results  Labs (all labs ordered are listed, but only abnormal results are displayed) Labs Reviewed - No data to display  EKG  EKG Interpretation None       Radiology No results found.  Procedures Procedures (including critical care time)  Medications Ordered in UC Medications  methylPREDNISolone sodium succinate (SOLU-MEDROL) 125 mg/2 mL injection 80 mg (not administered)     Initial Impression / Assessment and Plan / UC Course  I have reviewed the triage vital signs and the nursing notes.  Pertinent labs & imaging results that were available during my care of the patient were reviewed by me and considered in my medical decision making (see chart for details).    COPD exacerbation resulting from resolving influenza symptoms. Administered Solumedrol 80mg  IM Begin doxycycline 100mg  BID for 10 days. Begin prednisone burst/taper Wednesday 02/13/17. Continue plain guaifenesin daily, with plenty of water, for cough and congestion.   Get adequate rest.   May use Afrin nasal spray (or generic oxymetazoline) each morning for about 5 days and then discontinue.  Also recommend using saline nasal spray several times daily and saline nasal irrigation (AYR is a common brand).  Use Flonase nasal spray each morning after using Afrin nasal spray and saline nasal irrigation. Try warm salt water gargles for sore throat.  Stop all antihistamines for now, and other non-prescription cough/cold preparations.  If symptoms become  significantly worse during the night or over the weekend, proceed to the local emergency room.   Followup with [facility] as scheduled.    Final Clinical Impressions(s) / UC Diagnoses   Final diagnoses:  COPD with exacerbation [facility])    ED Discharge Orders        Ordered    predniSONE (DELTASONE) 20 MG tablet     02/12/17 1513    doxycycline (VIBRAMYCIN) 100 MG capsule  2 times daily     02/12/17 1513          Donna Christen  A, MD 02/23/17 1138
